# Patient Record
Sex: Male | Born: 1970 | Hispanic: No | Marital: Married | Smoking: Current every day smoker
Health system: Southern US, Community
[De-identification: ages and names within clinical notes are randomized; demographics above are authoritative.]

## PROBLEM LIST (undated history)

## (undated) DIAGNOSIS — E785 Hyperlipidemia, unspecified: Secondary | ICD-10-CM

## (undated) HISTORY — DX: Hyperlipidemia, unspecified: E78.5

## (undated) HISTORY — PX: ABLATION SAPHENOUS VEIN W/ RFA: SUR11

---

## 2010-06-09 ENCOUNTER — Emergency Department (HOSPITAL_BASED_OUTPATIENT_CLINIC_OR_DEPARTMENT_OTHER)
Admission: EM | Admit: 2010-06-09 | Discharge: 2010-06-09 | Payer: Self-pay | Source: Home / Self Care | Admitting: Emergency Medicine

## 2010-06-17 LAB — CBC
HCT: 39.2 % (ref 39.0–52.0)
Hemoglobin: 14.2 g/dL (ref 13.0–17.0)
MCH: 30.7 pg (ref 26.0–34.0)
MCHC: 36.2 g/dL — ABNORMAL HIGH (ref 30.0–36.0)
MCV: 84.8 fL (ref 78.0–100.0)
Platelets: 187 10*3/uL (ref 150–400)
RBC: 4.62 MIL/uL (ref 4.22–5.81)
RDW: 13 % (ref 11.5–15.5)
WBC: 8.5 10*3/uL (ref 4.0–10.5)

## 2010-06-17 LAB — DIFFERENTIAL
Basophils Absolute: 0 10*3/uL (ref 0.0–0.1)
Basophils Relative: 0 % (ref 0–1)
Eosinophils Absolute: 0.2 10*3/uL (ref 0.0–0.7)
Eosinophils Relative: 2 % (ref 0–5)
Lymphocytes Relative: 17 % (ref 12–46)
Lymphs Abs: 1.4 10*3/uL (ref 0.7–4.0)
Monocytes Absolute: 0.6 10*3/uL (ref 0.1–1.0)
Monocytes Relative: 8 % (ref 3–12)
Neutro Abs: 6.2 10*3/uL (ref 1.7–7.7)
Neutrophils Relative %: 74 % (ref 43–77)

## 2010-06-17 LAB — BASIC METABOLIC PANEL
BUN: 13 mg/dL (ref 6–23)
CO2: 25 mEq/L (ref 19–32)
Calcium: 9.4 mg/dL (ref 8.4–10.5)
Chloride: 106 mEq/L (ref 96–112)
Creatinine, Ser: 1 mg/dL (ref 0.4–1.5)
GFR calc Af Amer: >60 mL/min (ref >60)
GFR calc non Af Amer: >60 mL/min (ref >60)
Glucose, Bld: 101 mg/dL — ABNORMAL HIGH (ref 70–99)
Potassium: 3.9 mEq/L (ref 3.5–5.1)
Sodium: 142 mEq/L (ref 135–145)

## 2010-06-17 LAB — D-DIMER, QUANTITATIVE: D-Dimer, Quant: 0.23 ug/mL-FEU (ref 0.00–0.48)

## 2011-08-09 ENCOUNTER — Ambulatory Visit (INDEPENDENT_AMBULATORY_CARE_PROVIDER_SITE_OTHER): Payer: BC Managed Care – PPO | Admitting: Family Medicine

## 2011-08-09 VITALS — BP 118/77 | HR 94 | Temp 98.2°F | Resp 16 | Ht 69.0 in | Wt 160.8 lb

## 2011-08-09 DIAGNOSIS — E782 Mixed hyperlipidemia: Secondary | ICD-10-CM

## 2011-08-09 DIAGNOSIS — R21 Rash and other nonspecific skin eruption: Secondary | ICD-10-CM

## 2011-08-09 DIAGNOSIS — E785 Hyperlipidemia, unspecified: Secondary | ICD-10-CM

## 2011-08-09 MED ORDER — TACROLIMUS 0.03 % EX OINT: TOPICAL_OINTMENT | Freq: Two times a day (BID) | CUTANEOUS | Status: AC

## 2011-08-09 NOTE — Progress Notes (Signed)
41 year old man from [place] who comes in needing several things checked. Personal he has had hyperlipidemia and needs to have his cholesterol checked. Stop taking his Lipitor once a suitable level is now.  He also has had some intermittent headaches associated with some bumps in his occipital region. This is been going on for some time.  Finally he's had a rash on his abdomen it is not responded to cortisone cream. Has been going on for over 6 months.  Objective: HEENT normal  Chest clear  Neck: Supple with posterior cervical adenopathy which is unremarkable  Abdomen: 2 areas of hyper pigmentation with scaliness and no central clearing.  Assessment: Hyperlipidemia-status unclear, needs followup.  Recurrent viral headaches with adenopathy probably related to having kids at home.  Hyperpigmented areas on abdomen secondary to winter eczema.  Plan: Check lipids  Reassurance regarding adenopathy-may take ibuprofen when necessary  Protopic for rash

## 2011-08-10 LAB — LIPID PANEL
Cholesterol: 243 mg/dL — ABNORMAL HIGH (ref 0–200)
HDL: 54 mg/dL (ref >39)
LDL Cholesterol: 141 mg/dL — ABNORMAL HIGH (ref 0–99)
Total CHOL/HDL Ratio: 4.5 Ratio
Triglycerides: 238 mg/dL — ABNORMAL HIGH (ref <150)
VLDL: 48 mg/dL — ABNORMAL HIGH (ref 0–40)

## 2011-08-12 ENCOUNTER — Encounter: Payer: Self-pay | Admitting: Family Medicine

## 2011-08-14 ENCOUNTER — Telehealth: Payer: Self-pay

## 2011-08-14 NOTE — Telephone Encounter (Signed)
Needs cholestrol refill sent to [facility] [address] Did not know the name of medication

## 2011-08-15 MED ORDER — ATORVASTATIN CALCIUM 20 MG PO TABS: 80.0000 mg | ORAL_TABLET | Freq: Every day | ORAL | Status: DC

## 2011-08-15 MED ORDER — ATORVASTATIN CALCIUM 80 MG PO TABS: 80.0000 mg | ORAL_TABLET | Freq: Every day | ORAL | Status: DC

## 2011-08-15 NOTE — Telephone Encounter (Signed)
Called pharmacy pt was on Lipitor 80 so I sent in 3 months worth - please f/u before that runs out.

## 2011-08-16 NOTE — Telephone Encounter (Signed)
Pt notified to follow up before Rx runs out pt understood.

## 2011-11-01 IMAGING — CR DG CHEST 2V
2 series · 2 of 2 positions shown · non-contrast
Comparison: None.

CLINICAL DATA: Chest pain and shortness of breath.  Fever

CHEST - 2 VIEW

[w chest pa]
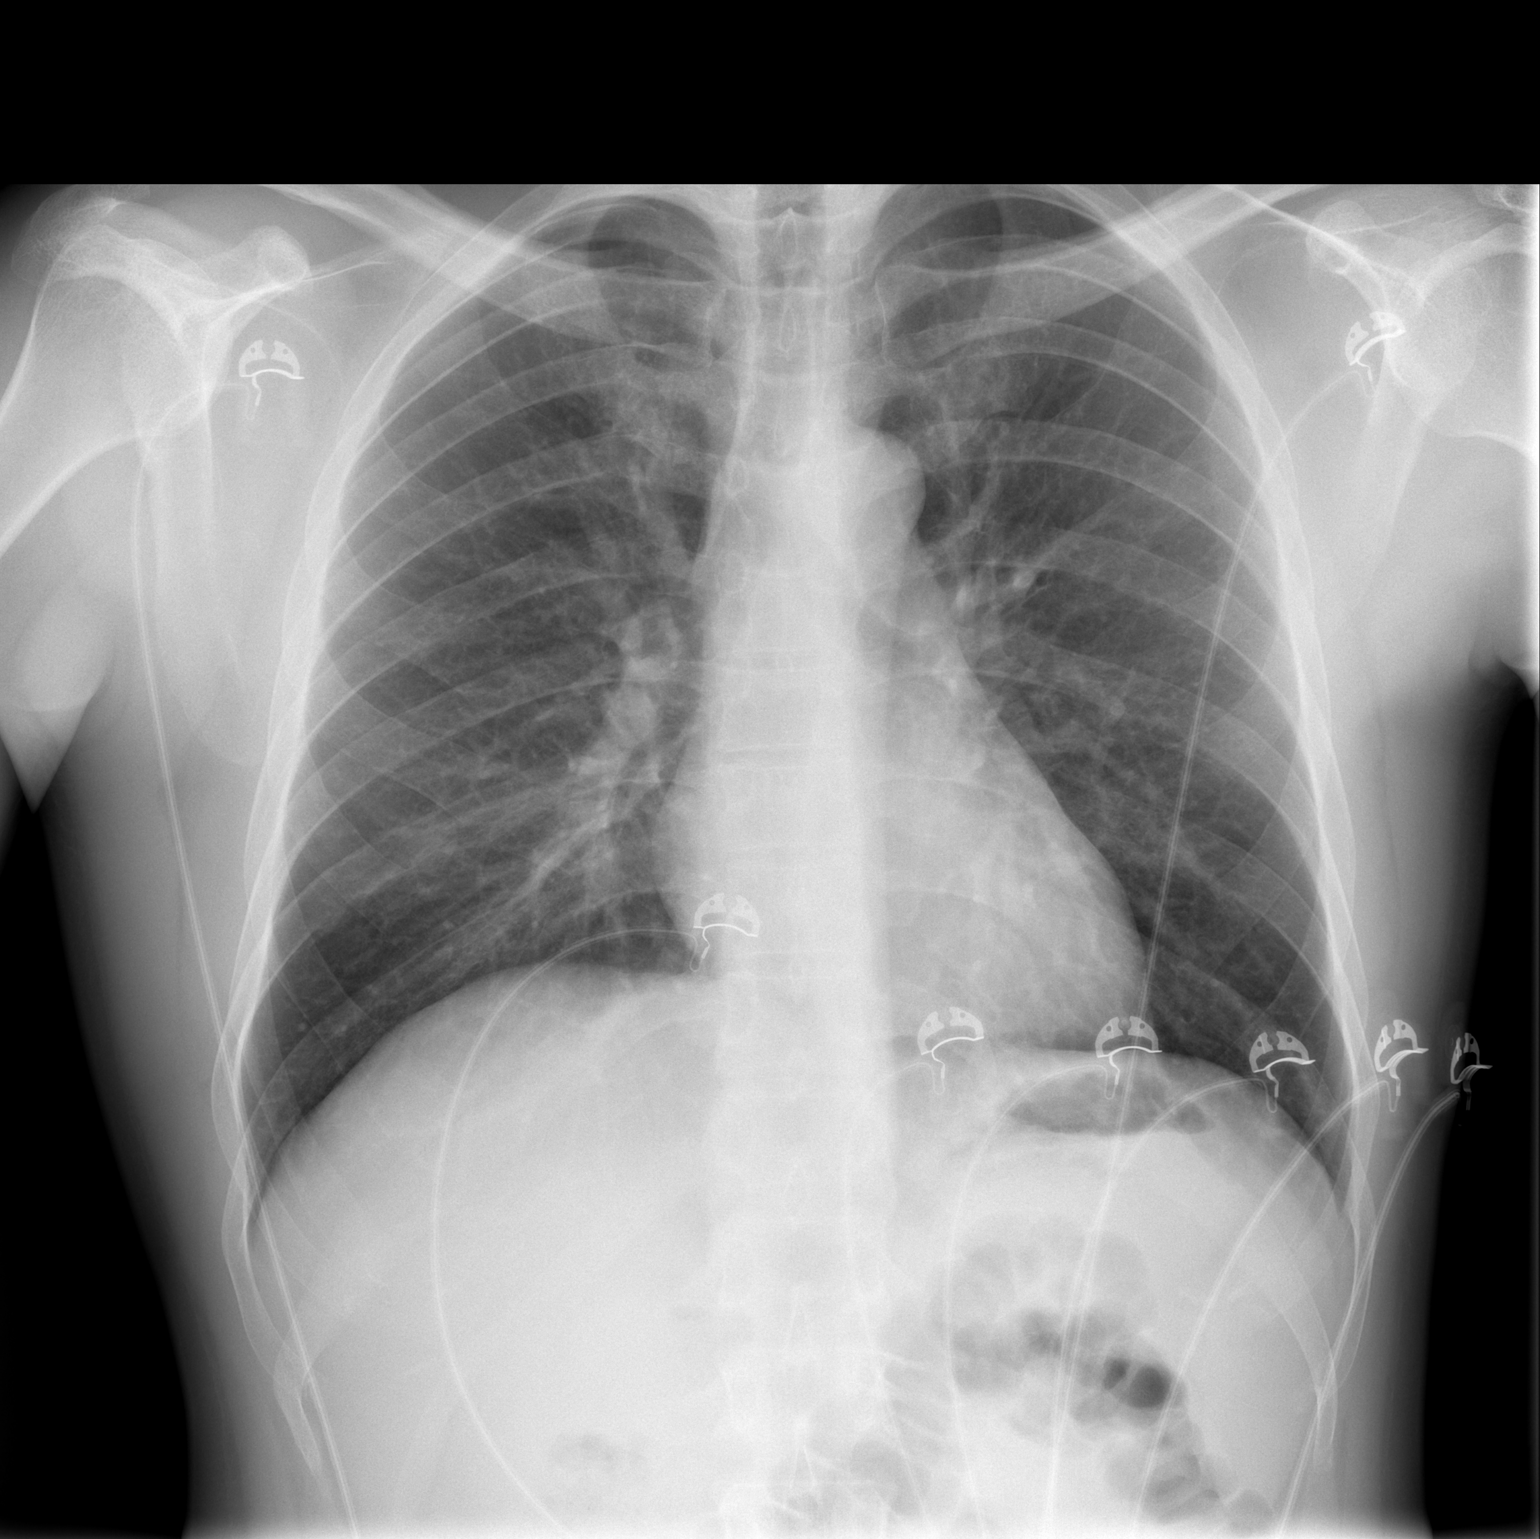

[w chest lat]
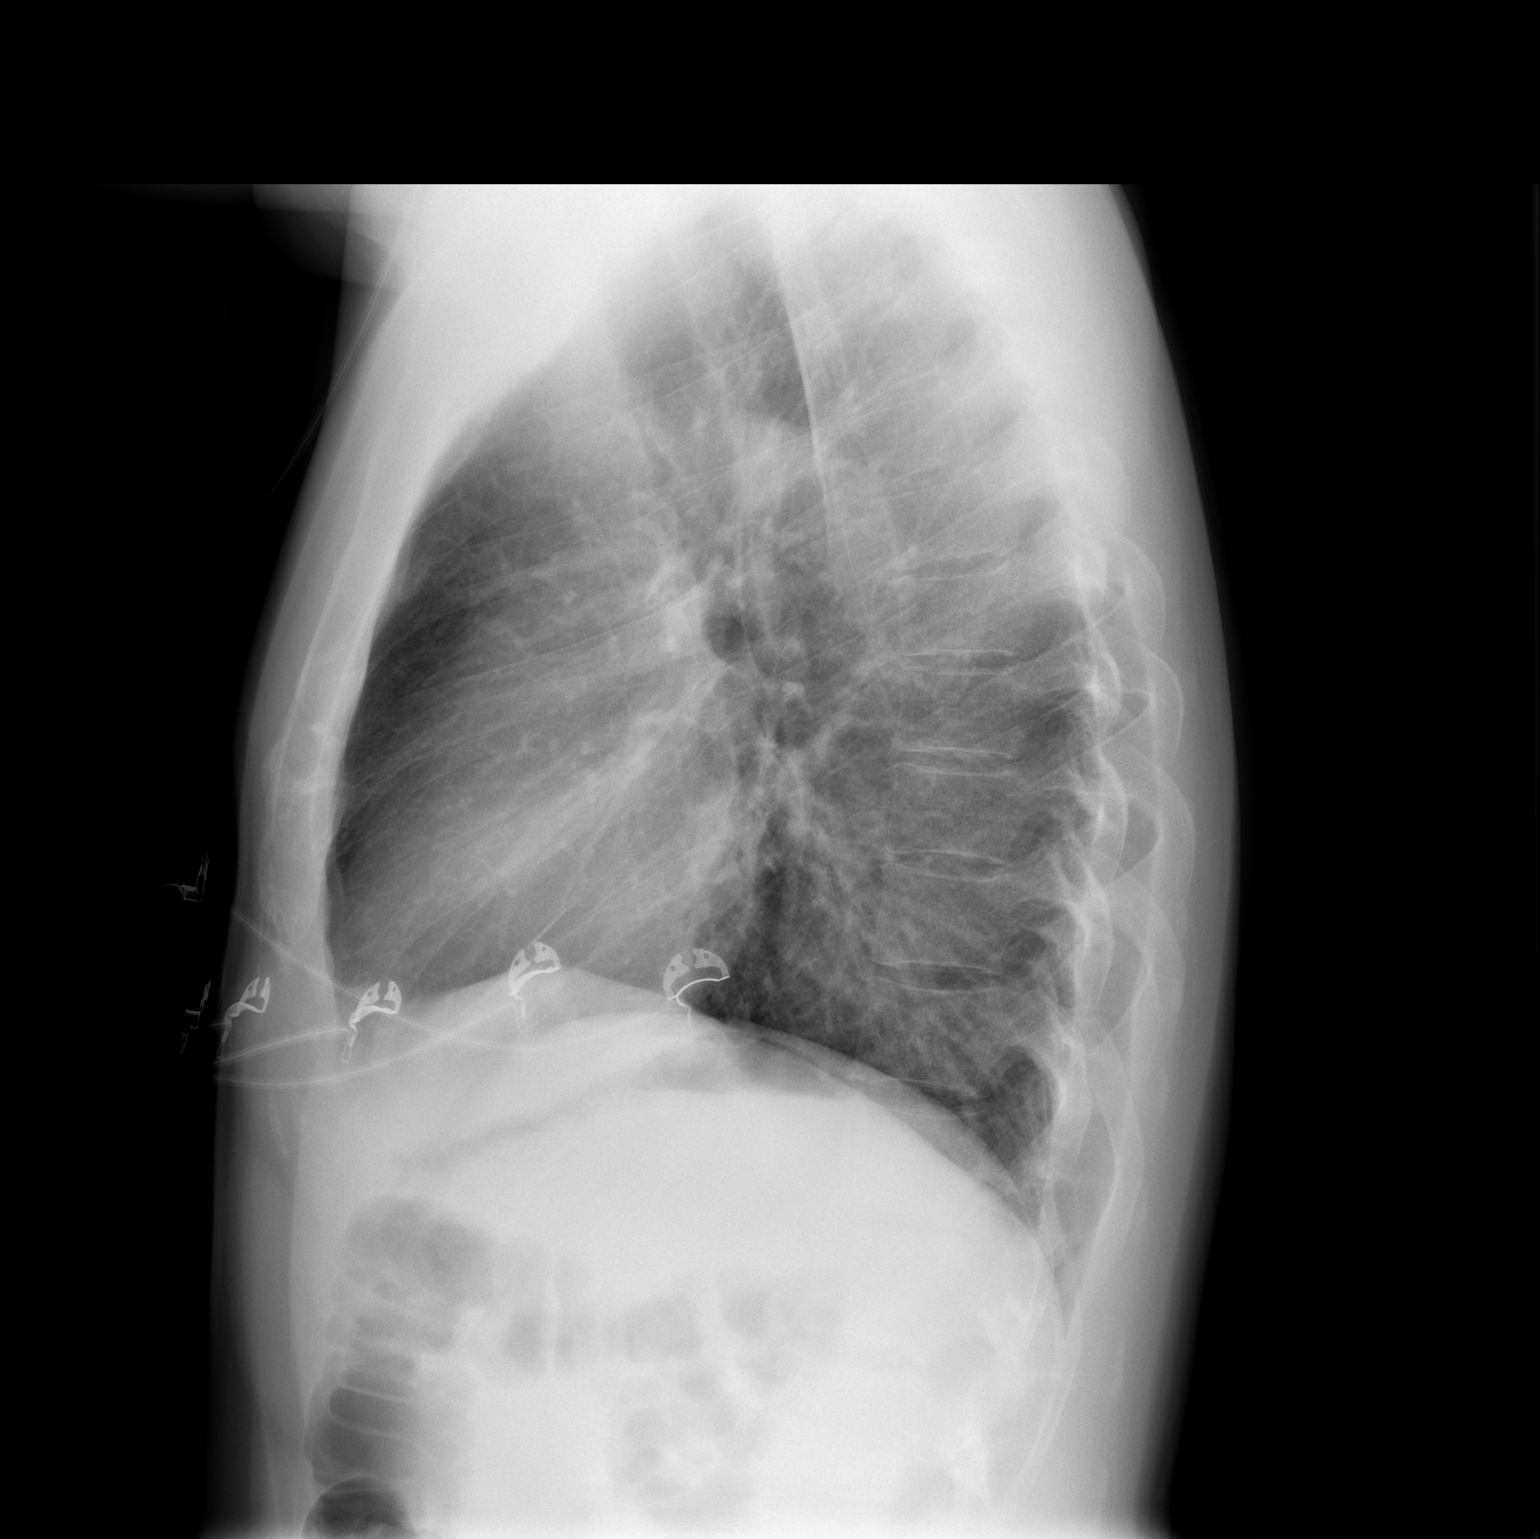

[2 of 2 positions shown; findings below may reference images not displayed]

FINDINGS: The heart size and mediastinal contours are within normal
limits.  Both lungs are clear.  The visualized skeletal structures
are unremarkable.
IMPRESSION: No acute cardiopulmonary abnormalities.

## 2011-11-29 ENCOUNTER — Other Ambulatory Visit: Payer: Self-pay | Admitting: Physician Assistant

## 2011-12-01 ENCOUNTER — Ambulatory Visit (INDEPENDENT_AMBULATORY_CARE_PROVIDER_SITE_OTHER): Payer: BC Managed Care – PPO | Admitting: Family Medicine

## 2011-12-01 VITALS — BP 120/79 | HR 91 | Temp 98.3°F | Resp 16 | Ht 69.0 in | Wt 168.0 lb

## 2011-12-01 DIAGNOSIS — E785 Hyperlipidemia, unspecified: Secondary | ICD-10-CM

## 2011-12-01 DIAGNOSIS — J309 Allergic rhinitis, unspecified: Secondary | ICD-10-CM

## 2011-12-01 LAB — COMPREHENSIVE METABOLIC PANEL
ALT: 41 U/L (ref 0–53)
AST: 23 U/L (ref 0–37)
Albumin: 4.3 g/dL (ref 3.5–5.2)
Alkaline Phosphatase: 87 U/L (ref 39–117)
BUN: 12 mg/dL (ref 6–23)
CO2: 27 mEq/L (ref 19–32)
Calcium: 9.4 mg/dL (ref 8.4–10.5)
Chloride: 106 mEq/L (ref 96–112)
Creat: 1.03 mg/dL (ref 0.50–1.35)
Glucose, Bld: 101 mg/dL — ABNORMAL HIGH (ref 70–99)
Potassium: 4.3 mEq/L (ref 3.5–5.3)
Sodium: 138 mEq/L (ref 135–145)
Total Bilirubin: 0.4 mg/dL (ref 0.3–1.2)
Total Protein: 6.9 g/dL (ref 6.0–8.3)

## 2011-12-01 LAB — LIPID PANEL
Cholesterol: 132 mg/dL (ref 0–200)
HDL: 38 mg/dL — ABNORMAL LOW (ref >39)
LDL Cholesterol: 73 mg/dL (ref 0–99)
Total CHOL/HDL Ratio: 3.5 Ratio
Triglycerides: 106 mg/dL (ref <150)
VLDL: 21 mg/dL (ref 0–40)

## 2011-12-01 MED ORDER — ATORVASTATIN CALCIUM 80 MG PO TABS: 80.0000 mg | ORAL_TABLET | Freq: Every day | ORAL | Status: DC

## 2011-12-01 NOTE — Patient Instructions (Addendum)
If you do not hear from [place] by the time he ran out of her medicines, called to check on the results before refilling.  Takes some over-the-counter  Claritin or Allegra for your congestion.

## 2011-12-01 NOTE — Progress Notes (Signed)
Subjective: 41 year old male from [place] who has been on cholesterol medications for about 3 months from Dr. Faustino Congress. He was placed on Lipitor 80 mg one daily. Use but it rechecked for the refill was due. Feeling well otherwise. He does have some allergy symptoms. No problems with the medications that he knows of.  Objective: TMs are normal. Throat clear. Neck supple without significant nodes. Chest clear to auscultation. Heart regular without any murmurs.  Assessment: Hyperlipidemia Allergic rhinitis   Plan: Lipids, chemistries. We'll n results of these in a few days before he picks up his new prescription.  Take some Claritin or Allegra for her the allergies.

## 2012-07-10 ENCOUNTER — Ambulatory Visit (INDEPENDENT_AMBULATORY_CARE_PROVIDER_SITE_OTHER): Payer: BC Managed Care – PPO | Admitting: Emergency Medicine

## 2012-07-10 VITALS — BP 126/72 | HR 83 | Temp 98.1°F | Resp 15 | Ht 69.25 in | Wt 161.0 lb

## 2012-07-10 DIAGNOSIS — E785 Hyperlipidemia, unspecified: Secondary | ICD-10-CM

## 2012-07-10 LAB — COMPREHENSIVE METABOLIC PANEL
ALT: 12 U/L (ref 0–53)
AST: 13 U/L (ref 0–37)
Albumin: 5 g/dL (ref 3.5–5.2)
Alkaline Phosphatase: 75 U/L (ref 39–117)
BUN: 12 mg/dL (ref 6–23)
CO2: 28 mEq/L (ref 19–32)
Calcium: 9.9 mg/dL (ref 8.4–10.5)
Chloride: 104 mEq/L (ref 96–112)
Creat: 0.99 mg/dL (ref 0.50–1.35)
Glucose, Bld: 90 mg/dL (ref 70–99)
Potassium: 4.5 mEq/L (ref 3.5–5.3)
Sodium: 138 mEq/L (ref 135–145)
Total Bilirubin: 0.6 mg/dL (ref 0.3–1.2)
Total Protein: 8.1 g/dL (ref 6.0–8.3)

## 2012-07-10 LAB — POCT CBC
Granulocyte percent: 54.5 %G (ref 37–80)
HCT, POC: 44.8 % (ref 43.5–53.7)
Hemoglobin: 14.7 g/dL (ref 14.1–18.1)
Lymph, poc: 2.4 (ref 0.6–3.4)
MCH, POC: 30.8 pg (ref 27–31.2)
MCHC: 32.8 g/dL (ref 31.8–35.4)
MCV: 93.7 fL (ref 80–97)
MID (cbc): 0.5 (ref 0–0.9)
MPV: 7.9 fL (ref 0–99.8)
POC Granulocyte: 3.4 (ref 2–6.9)
POC LYMPH PERCENT: 38.1 %L (ref 10–50)
POC MID %: 7.4 %M (ref 0–12)
Platelet Count, POC: 259 10*3/uL (ref 142–424)
RBC: 4.78 M/uL (ref 4.69–6.13)
RDW, POC: 14.1 %
WBC: 6.2 10*3/uL (ref 4.6–10.2)

## 2012-07-10 LAB — POCT URINALYSIS DIPSTICK
Bilirubin, UA: NEGATIVE
Blood, UA: NEGATIVE
Glucose, UA: NEGATIVE
Ketones, UA: NEGATIVE
Leukocytes, UA: NEGATIVE
Nitrite, UA: NEGATIVE
Protein, UA: NEGATIVE
Spec Grav, UA: 1.015
Urobilinogen, UA: 0.2
pH, UA: 8

## 2012-07-10 LAB — LIPID PANEL
Cholesterol: 292 mg/dL — ABNORMAL HIGH (ref 0–200)
HDL: 45 mg/dL (ref >39)
LDL Cholesterol: 223 mg/dL — ABNORMAL HIGH (ref 0–99)
Total CHOL/HDL Ratio: 6.5 Ratio
Triglycerides: 122 mg/dL (ref <150)
VLDL: 24 mg/dL (ref 0–40)

## 2012-07-10 LAB — TSH: TSH: 1.069 u[IU]/mL (ref 0.350–4.500)

## 2012-07-10 NOTE — Patient Instructions (Addendum)
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
## 2012-07-11 LAB — PSA: PSA: 0.57 ng/mL (ref <=4.00)

## 2012-07-12 LAB — VITAMIN D 25 HYDROXY (VIT D DEFICIENCY, FRACTURES): Vit D, 25-Hydroxy: 24 ng/mL — ABNORMAL LOW (ref 30–89)

## 2012-07-13 MED ORDER — VITAMIN D (ERGOCALCIFEROL) 1.25 MG (50000 UNIT) PO CAPS: 50000.0000 [IU] | ORAL_CAPSULE | ORAL | Status: DC

## 2012-07-13 MED ORDER — ATORVASTATIN CALCIUM 80 MG PO TABS: 80.0000 mg | ORAL_TABLET | Freq: Every day | ORAL | Status: AC

## 2015-01-17 ENCOUNTER — Encounter (HOSPITAL_BASED_OUTPATIENT_CLINIC_OR_DEPARTMENT_OTHER): Payer: Self-pay

## 2015-01-17 ENCOUNTER — Emergency Department (HOSPITAL_BASED_OUTPATIENT_CLINIC_OR_DEPARTMENT_OTHER)
Admission: EM | Admit: 2015-01-17 | Discharge: 2015-01-17 | Disposition: A | Discharge status: Home / Self Care | Payer: 59 | Attending: Emergency Medicine | Admitting: Emergency Medicine

## 2015-01-17 DIAGNOSIS — Y92410 Unspecified street and highway as the place of occurrence of the external cause: Secondary | ICD-10-CM | POA: Diagnosis not present

## 2015-01-17 DIAGNOSIS — S0990XA Unspecified injury of head, initial encounter: Secondary | ICD-10-CM | POA: Diagnosis present

## 2015-01-17 DIAGNOSIS — Y998 Other external cause status: Secondary | ICD-10-CM | POA: Diagnosis not present

## 2015-01-17 DIAGNOSIS — E785 Hyperlipidemia, unspecified: Secondary | ICD-10-CM | POA: Insufficient documentation

## 2015-01-17 DIAGNOSIS — Z720 Tobacco use: Secondary | ICD-10-CM | POA: Insufficient documentation

## 2015-01-17 DIAGNOSIS — Z79899 Other long term (current) drug therapy: Secondary | ICD-10-CM | POA: Insufficient documentation

## 2015-01-17 DIAGNOSIS — Y9389 Activity, other specified: Secondary | ICD-10-CM | POA: Insufficient documentation
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
## 2015-01-17 NOTE — Discharge Instructions (Signed)
